# Patient Record
Sex: Female | Born: 1965 | Race: Asian | Hispanic: No | Marital: Married
Health system: Southern US, Community
[De-identification: ages and names within clinical notes are randomized; demographics above are authoritative.]

---

## 2004-10-16 ENCOUNTER — Inpatient Hospital Stay (HOSPITAL_COMMUNITY): Admission: AD | Admit: 2004-10-16 | Discharge: 2004-10-20 | Payer: Self-pay | Admitting: Obstetrics and Gynecology

## 2005-12-04 ENCOUNTER — Encounter: Admission: RE | Admit: 2005-12-04 | Discharge: 2005-12-04 | Payer: Self-pay | Admitting: Obstetrics and Gynecology

## 2006-12-07 ENCOUNTER — Encounter: Admission: RE | Admit: 2006-12-07 | Discharge: 2006-12-07 | Payer: Self-pay | Admitting: Obstetrics and Gynecology

## 2007-12-08 ENCOUNTER — Encounter: Admission: RE | Admit: 2007-12-08 | Discharge: 2007-12-08 | Payer: Self-pay | Admitting: Obstetrics

## 2008-07-24 ENCOUNTER — Ambulatory Visit (HOSPITAL_COMMUNITY): Admission: RE | Admit: 2008-07-24 | Discharge: 2008-07-24 | Payer: Self-pay | Admitting: General Surgery

## 2008-07-24 ENCOUNTER — Encounter (INDEPENDENT_AMBULATORY_CARE_PROVIDER_SITE_OTHER): Payer: Self-pay | Admitting: General Surgery

## 2008-12-14 ENCOUNTER — Encounter: Admission: RE | Admit: 2008-12-14 | Discharge: 2008-12-14 | Payer: Self-pay | Admitting: Obstetrics

## 2009-12-16 ENCOUNTER — Encounter: Admission: RE | Admit: 2009-12-16 | Discharge: 2009-12-16 | Payer: Self-pay | Admitting: Obstetrics

## 2010-04-06 ENCOUNTER — Encounter: Payer: Self-pay | Admitting: Obstetrics and Gynecology

## 2010-06-24 LAB — DIFFERENTIAL
Basophils Absolute: 0 10*3/uL (ref 0.0–0.1)
Basophils Relative: 0 % (ref 0–1)
Eosinophils Relative: 2 % (ref 0–5)
Monocytes Absolute: 0.4 10*3/uL (ref 0.1–1.0)
Monocytes Relative: 10 % (ref 3–12)

## 2010-06-24 LAB — COMPREHENSIVE METABOLIC PANEL
AST: 24 U/L (ref 0–37)
Albumin: 4.1 g/dL (ref 3.5–5.2)
Alkaline Phosphatase: 47 U/L (ref 39–117)
Chloride: 102 mEq/L (ref 96–112)
GFR calc Af Amer: >60 mL/min (ref >60)
Potassium: 4.2 mEq/L (ref 3.5–5.1)
Sodium: 139 mEq/L (ref 135–145)
Total Bilirubin: 1.3 mg/dL — ABNORMAL HIGH (ref 0.3–1.2)

## 2010-06-24 LAB — CBC
HCT: 38.1 % (ref 36.0–46.0)
Platelets: 351 10*3/uL (ref 150–400)
WBC: 4.3 10*3/uL (ref 4.0–10.5)

## 2010-06-24 LAB — PREGNANCY, URINE: Preg Test, Ur: NEGATIVE

## 2010-07-29 NOTE — Op Note (Signed)
Shelly Miller, Shelly Miller             ACCOUNT NO.:  [id number]   MEDICAL RECORD NO.:  [id number]          PATIENT TYPE:  AMB   LOCATION:  DAY                          FACILITY:  [facility]   PHYSICIAN:  Sharlet Salina T. Hoxworth, M.D.DATE OF BIRTH:  [birth year]   DATE OF PROCEDURE:  07/24/2008  DATE OF DISCHARGE:                               OPERATIVE REPORT   PREOPERATIVE DIAGNOSIS:  Internal hemorrhoids with prolapse and  bleeding.   POSTOPERATIVE DIAGNOSIS:  Internal hemorrhoids with prolapse and  bleeding.   SURGICAL PROCEDURES:  PPH stapled hemorrhoidectomy.   SURGEON:  Lorne Skeens. Hoxworth, M.D.   ANESTHESIA:  General.   BRIEF HISTORY:  Ms. Busser is a 45 year old female who, since  pregnancy, has had gradually increasing problems with internal  hemorrhoids which cause prolapse, swelling and now daily significant  bleeding.  She has had a colonoscopy showing no other problems and large  internal hemorrhoids were confirmed on anoscopy in the office.  We  discussed options and I have recommended proceeding with PPH.  The  nature of the procedure, its indications, expected results, recovery,  risks of bleeding, infection and rare rectovaginal fistula were  discussed and understood.  She is now brought to the operating room for  this procedure.   DESCRIPTION OF OPERATION:  The rectal prep was performed at home.  The  patient was brought to the operating room and general endotracheal  anesthesia was used on the stretcher and she was rolled into a carefully  padded prone jack-knife position.  Buttocks were taped apart.  The  perineum was widely sterilely prepped and draped.  Correct patient and  procedure were verified.  Initially 1 mL of Wydase and 9 mL of Marcaine  was injected into the hemorrhoidal groups.  Following this, a perirectal  block with 40 mL of Marcaine was performed.  The anus was then gently  dilated in 3 fingers and then the large bullet rectal retractor placed.  Noted were very large internal hemorrhoids with some degree of  friability.  Beginning at the posterior midline, carefully measured 5 cm  from the dentate line, a concentric pursestring suture of 2-0 Prolene  was placed incorporating mucosa and submucosa.  The bullet retractor was  then removed and the pursestring suture palpated and was symmetrical and  intact.  The sutures were then brought through the [facility] retractor which  was positioned and then the anvil of the PPH stapler introduced through  the pursestring suture which was tied into place snugly around the shaft  of the stapler.  The sutures were brought through the stapler and then  the stapler was closed, advancing it into the rectum more than 4 cm  beyond the dentate line.  The stapler was closed snugly and left in  place for 1 minute for hemostasis and then fired without difficulty.  The stapler was removed without difficulty and tissue ring was examined  which contained a nice 1-2-cm cuff of mucosa and submucosa.  The staple  line was then carefully examined, was intact and there was just one area  of minor oozing posteriorly which was controlled with  a figure-of-eight  suture of 3-0 chromic.  Complete hemostasis was assured and then a pack  of Gelfoam and lidocaine was placed.  Sponge, needle and instrument  counts were correct.  The patient was taken to recovery in good  condition.      Lorne Skeens. Hoxworth, M.D.  Electronically Signed     BTH/MEDQ  D:  07/24/2008  T:  07/24/2008  Job:  [id number]

## 2010-08-01 NOTE — Op Note (Signed)
[name], Shelly Miller             ACCOUNT NO.:  [id number]   MEDICAL RECORD NO.:  [id number]          PATIENT TYPE:  INP   LOCATION:  9127                          FACILITY:  [facility]   PHYSICIAN:  Genia Del, M.D.DATE OF BIRTH:  03/24/1965   DATE OF PROCEDURE:  10/17/2004  DATE OF DISCHARGE:                                 OPERATIVE REPORT   PREOPERATIVE DIAGNOSES:  1.  Intrauterine pregnancy 41 weeks and 5 days.  2.  Induction for post dates.  3.  Failure to progress.   POSTOPERATIVE DIAGNOSES:  1.  Intrauterine pregnancy 41 weeks and 5 days.  2.  Induction for post dates.  3.  Failure to progress.   OPERATION/PROCEDURE:  Intervention urgent primary low transverse cesarean  section.   ANESTHESIA:  Epidural.   SURGEON:  Genia Del, M.D.   ANESTHESIOLOGIST:  Quillian Quince, M.D.   DESCRIPTION OF PROCEDURE:  Under epidural anesthesia, the patient is in 15-  degree left decubitus position. She is prepped with Betadine on the  abdominal, suprapubic, and vulvar areas.  The bladder catheter is already in  place and the patient is draped as usual.  The level of anesthesia is  verified and is adequate.  We infiltrate the subcutaneous tissue with  Marcaine 0.25% plain, 20 mL and then make a Pfannenstiel incision with a  scalpel.  We opened the adipose tissue with the electrocautery and opened  the aponeurosis transversely with the electrocautery and Mayo scissors.  The  rectus muscles are separated from the aponeurosis on the midline superiorly  and inferiorly.  We then opened the parietal peritoneum longitudinally with  Metzenbaum scissors.  We then opened the visceral peritoneum over the lower  uterine segment transversely with Metzenbaum scissors.  The bladder is  reclined downward.  The bladder retractor is put in place.  We make a low  transverse hysterotomy with the scalpel and extend it on each side with  dressing scissors.  The amniotic fluid is clear.  The  fetus is in cephalic  presentation, occiput posterior.  Birth of a baby girl at 2121. A loose  nuchal cord is present.  The baby is suctioned after delivery of the head.  We then clamped and cut the cord.  The baby was given to the neonatal team.  Apgars are 9 and 9.  PH is done and comes back at 7.34.  Spontaneous  evacuation of the placenta, three vessels, complete.  Revision of the  intrauterine cavity.  Pitocin IV is started.  The uterus responds well with  good uterine tones.  Both ovaries are normal to inspection.  Both tubes are  normal as well.  We close the hysterotomy with a first locked running suture  of 0 Vicryl.  A second layer imbricated of 0 Vicryl is done.  An  X stitch  with 0 Vicryl is added to complete hemostasis on the right aspect of the  incision.  We then irrigated and suctioned the abdominopelvic cavity.  Complete hemostasis with the electrocautery on the rectus muscles and the  bladder flap.  We then closed the aponeurosis in the  hospital two half-  running sutures of 0 Vicryl. With complete hemostasis of the adipose tissue  with the electrocautery.  We reapproximated the skin with staples.  A dry  dressing is applied.  Estimated blood loss is 800  mL.  The counts of sponges and instruments were complete x2.  No  complications occurred and the patient was brought to the recovery room in  good status.   A Flagyl IV was given after cord clamping.       ML/MEDQ  D:  10/17/2004  T:  10/18/2004  Job:  [id number]

## 2010-08-01 NOTE — Discharge Summary (Signed)
Shelly Miller, Shelly Miller             ACCOUNT NO.:  [id number]   MEDICAL RECORD NO.:  [id number]          PATIENT TYPE:  INP   LOCATION:  9127                          FACILITY:  [facility]   PHYSICIAN:  Genia Del, M.D.DATE OF BIRTH:  January 28, 1966   DATE OF ADMISSION:  10/16/2004  DATE OF DISCHARGE:  10/20/2004                                 DISCHARGE SUMMARY   ADMISSION DIAGNOSIS:  Forty-one-plus weeks gestation, for induction.   DISCHARGE DIAGNOSES:  1.  Forty-one-plus weeks gestation, for induction.  2.  Failure to progress in dilatation.   SURGERY:  Urgent primary low transverse cesarean section, birth of a baby  girl.   HOSPITAL COURSE:  The patient progressed into labor until 6 cm. It was a  right posterior occiput presentation. In spite of good uterine contractions,  she failed to progress further and a decision was taken to proceed with  urgent C-section. She had an urgent primary low transverse cesarean section,  birth of a baby girl at 2121, pH was 7.34. Estimated blood loss was 800 mL.  No complication occurred. Her postpartum evolution was normal. She had a  hemoglobin at 10.8 postpartum. She was discharged home on postoperative day  #3 in good status. Postoperative advice were given. She will come back to  [facility] in 4 weeks. She was given a prescription of Tylox p.r.n. for  pain.      Genia Del, M.D.  Electronically Signed     ML/MEDQ  D:  11/24/2004  T:  11/24/2004  Job:  [id number]

## 2010-08-01 NOTE — H&P (Signed)
NAMESTEFFANIE, MINGLE             ACCOUNT NO.:  [id number]   MEDICAL RECORD NO.:  [id number]          PATIENT TYPE:  INP   LOCATION:  9173                          FACILITY:  [facility]   PHYSICIAN:  Richardean Sale, M.D.   DATE OF BIRTH:  11/03/1965   DATE OF ADMISSION:  10/16/2004  DATE OF DISCHARGE:                                HISTORY & PHYSICAL   ADMITTING DIAGNOSIS:  Forty-one plus week intrauterine pregnancy, for  induction of labor.   HISTORY OF PRESENT ILLNESS:  This is a 45 year old, gravida 1, para 0, Asian  female who is at 11.[redacted] weeks gestation with a due date of October 07, 2004  confirmed by first trimester ultrasound at another medical care facility,  who presents today for induction of labor secondary to post dates.  The  patient reports good movement.  Denies any loss of fluid or vaginal  bleeding.  Complains of occasional contractions.  Denies headaches, visual  changes, or epigastric pain.   PRENATAL CARE:  Initially, prenatal care was in [place], [place].  The  patient transferred care to [facility] at 33 weeks.  The pregnancy is  complicated by advanced maternal age.  The patient underwent amniocentesis,  which was normal.   The patient presents today for induction of labor secondary to post dates.   PAST OBSTETRIC HISTORY:  Gravida 1, para 0.   GYNECOLOGIC HISTORY:  No known abnormal Pap smears or sexually transmitted  infections.   MEDICAL HISTORY:  History of anemia.  The last hemoglobin was within normal  limits.   PAST SURGICAL HISTORY:  None.   SOCIAL HISTORY:  She is married.  Denies tobacco, alcohol, or drugs.   FAMILY HISTORY:  Mother had cancer of the cervix at age 86.  Otherwise,  noncontributory.  No known birth defects, congenital anomalies, cystic  fibrosis, Down syndrome, or other inherited illnesses.   MEDICATIONS:  Prenatal vitamins.   ALLERGIES:  PENICILLIN.   PHYSICAL EXAMINATION:  VITAL SIGNS:  Blood pressure is 124/73, heart  rate  73, respirations 20, temperature 98.1.  Fetal heart rate tracing is  reactive.  Tocometer shows no contractions.  GENERAL:  She is a well-developed, well-nourished Asian female who is in no  acute distress.  HEART:  Regular rate and rhythm.  LUNGS:  Clear to auscultation bilaterally.  ABDOMEN:  Gravid, soft, nontender.  Fundal height is AGA.  PELVIC:  Cervix is flared, 2 cm long, -2 station.  Ultrasound was performed  to confirm vertex presentation.  There are multiple external hemorrhoids  present that are not thrombosed.  EXTREMITIES:  Trace edema in the feet bilaterally.  Nontender.   PRENATAL LABORATORIES:  Blood type is A positive, antibody screen negative,  RPR nonreactive, rubella immune.  Hepatitis B surface antigen nonreactive.  HIV was declined.  Group B beta strep negative.  Amniocentesis showed normal  chromosomes.  Glucose tolerance test performed at another institution was  normal.  The last ultrasound on October 10, 2004 at 40.[redacted] weeks gestation showed  estimated fetal weight of 3737 gm, which is the 54th percentile.  Amniotic  fluid index was normal.  ASSESSMENT:  A 45 year old, gravida 1, para 0, Asian female at 41.[redacted] weeks  gestation by first trimester ultrasound, who presents for induction of  labor.   PLAN:  1.  Will admit to labor and delivery.  Check routine labs.  2.  Will use Cervidil for cervical ripening, and start low-dose Pitocin, and      perform amniotomy when able.  3.  Continuous fetal monitoring.  4.  I reviewed the increased risk of cesarean section, given post date      induction.  The patient and her husband voice understanding of the risks      of induction, and have been counseled on their alternatives, and desire      to proceed with induction.  All questions answered and informed consent      obtained.       JW/MEDQ  D:  10/16/2004  T:  10/17/2004  Job:  [id number]

## 2011-03-27 ENCOUNTER — Encounter (INDEPENDENT_AMBULATORY_CARE_PROVIDER_SITE_OTHER): Payer: Self-pay | Admitting: General Surgery

## 2012-06-07 ENCOUNTER — Other Ambulatory Visit: Payer: Self-pay

## 2012-06-07 DIAGNOSIS — Z1231 Encounter for screening mammogram for malignant neoplasm of breast: Secondary | ICD-10-CM

## 2012-07-11 ENCOUNTER — Ambulatory Visit
Admission: RE | Admit: 2012-07-11 | Discharge: 2012-07-11 | Disposition: A | Discharge status: Home / Self Care | Payer: BC Managed Care – PPO | Source: Ambulatory Visit

## 2012-07-11 DIAGNOSIS — Z1231 Encounter for screening mammogram for malignant neoplasm of breast: Secondary | ICD-10-CM

## 2013-06-23 ENCOUNTER — Other Ambulatory Visit: Payer: Self-pay

## 2013-06-23 DIAGNOSIS — Z1231 Encounter for screening mammogram for malignant neoplasm of breast: Secondary | ICD-10-CM

## 2013-07-12 ENCOUNTER — Ambulatory Visit
Admission: RE | Admit: 2013-07-12 | Discharge: 2013-07-12 | Disposition: A | Discharge status: Home / Self Care | Payer: BC Managed Care – PPO | Source: Ambulatory Visit

## 2013-07-12 ENCOUNTER — Encounter (INDEPENDENT_AMBULATORY_CARE_PROVIDER_SITE_OTHER): Payer: Self-pay

## 2013-07-12 DIAGNOSIS — Z1231 Encounter for screening mammogram for malignant neoplasm of breast: Secondary | ICD-10-CM

## 2016-03-02 ENCOUNTER — Other Ambulatory Visit: Payer: Self-pay | Admitting: Obstetrics

## 2016-03-02 DIAGNOSIS — Z1231 Encounter for screening mammogram for malignant neoplasm of breast: Secondary | ICD-10-CM

## 2016-03-03 ENCOUNTER — Ambulatory Visit
Admission: RE | Admit: 2016-03-03 | Discharge: 2016-03-03 | Disposition: A | Discharge status: Home / Self Care | Payer: Self-pay | Source: Ambulatory Visit | Attending: Obstetrics | Admitting: Obstetrics

## 2016-03-03 DIAGNOSIS — Z1231 Encounter for screening mammogram for malignant neoplasm of breast: Secondary | ICD-10-CM | POA: Diagnosis not present

## 2016-09-30 DIAGNOSIS — R0981 Nasal congestion: Secondary | ICD-10-CM | POA: Diagnosis not present

## 2016-09-30 DIAGNOSIS — J020 Streptococcal pharyngitis: Secondary | ICD-10-CM | POA: Diagnosis not present

## 2016-11-10 DIAGNOSIS — Z6831 Body mass index (BMI) 31.0-31.9, adult: Secondary | ICD-10-CM | POA: Diagnosis not present

## 2016-11-10 DIAGNOSIS — Z1151 Encounter for screening for human papillomavirus (HPV): Secondary | ICD-10-CM | POA: Diagnosis not present

## 2016-11-10 DIAGNOSIS — Z01419 Encounter for gynecological examination (general) (routine) without abnormal findings: Secondary | ICD-10-CM | POA: Diagnosis not present

## 2017-08-16 ENCOUNTER — Ambulatory Visit
Admission: RE | Admit: 2017-08-16 | Discharge: 2017-08-16 | Disposition: A | Discharge status: Home / Self Care | Payer: BLUE CROSS/BLUE SHIELD | Source: Ambulatory Visit | Attending: Family Medicine | Admitting: Family Medicine

## 2017-08-16 ENCOUNTER — Other Ambulatory Visit: Payer: Self-pay | Admitting: Family Medicine

## 2017-08-16 DIAGNOSIS — T1490XA Injury, unspecified, initial encounter: Secondary | ICD-10-CM

## 2017-08-16 DIAGNOSIS — S92354A Nondisplaced fracture of fifth metatarsal bone, right foot, initial encounter for closed fracture: Secondary | ICD-10-CM | POA: Diagnosis not present

## 2018-02-02 DIAGNOSIS — Z01419 Encounter for gynecological examination (general) (routine) without abnormal findings: Secondary | ICD-10-CM | POA: Diagnosis not present

## 2018-02-02 DIAGNOSIS — Z6828 Body mass index (BMI) 28.0-28.9, adult: Secondary | ICD-10-CM | POA: Diagnosis not present

## 2018-02-02 DIAGNOSIS — Z1231 Encounter for screening mammogram for malignant neoplasm of breast: Secondary | ICD-10-CM | POA: Diagnosis not present

## 2018-02-02 DIAGNOSIS — Z30432 Encounter for removal of intrauterine contraceptive device: Secondary | ICD-10-CM | POA: Diagnosis not present

## 2019-02-16 DIAGNOSIS — B372 Candidiasis of skin and nail: Secondary | ICD-10-CM | POA: Diagnosis not present

## 2019-02-16 DIAGNOSIS — N952 Postmenopausal atrophic vaginitis: Secondary | ICD-10-CM | POA: Diagnosis not present

## 2019-02-16 DIAGNOSIS — Z01419 Encounter for gynecological examination (general) (routine) without abnormal findings: Secondary | ICD-10-CM | POA: Diagnosis not present

## 2019-02-16 DIAGNOSIS — Z6830 Body mass index (BMI) 30.0-30.9, adult: Secondary | ICD-10-CM | POA: Diagnosis not present

## 2019-05-12 ENCOUNTER — Other Ambulatory Visit: Payer: Self-pay | Admitting: Family Medicine

## 2019-05-12 DIAGNOSIS — R519 Headache, unspecified: Secondary | ICD-10-CM

## 2019-05-12 NOTE — Progress Notes (Signed)
PT w/ questionable h/o Stroke as etiology for permenant vision loss years ago. Developed new intermittent HA features for past 2-3 mo. Getting worse. No other neurological deficits. HA in forehead region and lasting minutes.

## 2019-06-06 ENCOUNTER — Other Ambulatory Visit: Payer: Self-pay

## 2019-06-06 ENCOUNTER — Ambulatory Visit
Admission: RE | Admit: 2019-06-06 | Discharge: 2019-06-06 | Disposition: A | Discharge status: Home / Self Care | Payer: BC Managed Care – PPO | Source: Ambulatory Visit | Attending: Family Medicine | Admitting: Family Medicine

## 2019-06-06 DIAGNOSIS — R519 Headache, unspecified: Secondary | ICD-10-CM | POA: Diagnosis not present

## 2019-07-12 DIAGNOSIS — H524 Presbyopia: Secondary | ICD-10-CM | POA: Diagnosis not present

## 2019-07-12 DIAGNOSIS — H25013 Cortical age-related cataract, bilateral: Secondary | ICD-10-CM | POA: Diagnosis not present

## 2019-07-12 DIAGNOSIS — H2513 Age-related nuclear cataract, bilateral: Secondary | ICD-10-CM | POA: Diagnosis not present

## 2019-07-12 DIAGNOSIS — H5203 Hypermetropia, bilateral: Secondary | ICD-10-CM | POA: Diagnosis not present

## 2020-11-20 IMAGING — MR MR HEAD W/O CM
3 series · 48 of 48 positions shown · non-contrast
Comparison: None.

CLINICAL DATA: Chronic headache with no new features

EXAM:
MRI HEAD WITHOUT CONTRAST
MRA HEAD WITHOUT CONTRAST
TECHNIQUE: Multiplanar, multiecho pulse sequences of the brain and surrounding
structures were obtained without intravenous contrast. Angiographic
images of the head were obtained using MRA technique without
contrast.

[Series 12: DWI · coronal · 5.0mm · 1.80mm/px · 24 of 68 slices shown]
[im 1/68]
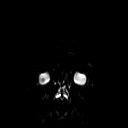
[im 3/68]
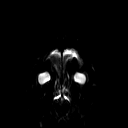
[im 6/68]
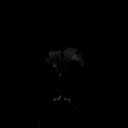
[im 9/68]
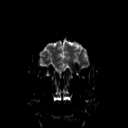
[im 12/68]
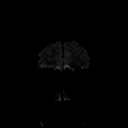
[im 15/68]
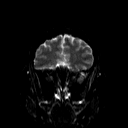
[im 18/68]
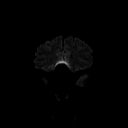
[im 21/68]
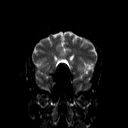
[im 24/68]
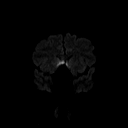
[im 27/68]
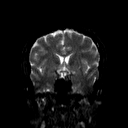
[im 30/68]
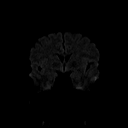
[im 33/68]
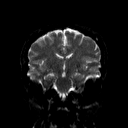
[im 35/68]
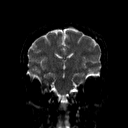
[im 38/68]
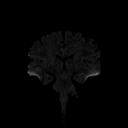
[im 41/68]
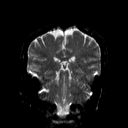
[im 44/68]
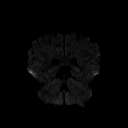
[im 47/68]
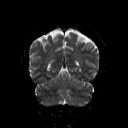
[im 50/68]
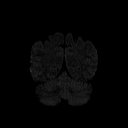
[im 53/68]
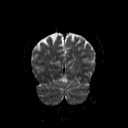
[im 56/68]
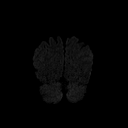
[im 59/68]
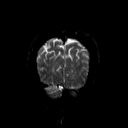
[im 62/68]
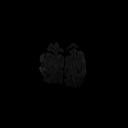
[im 65/68]
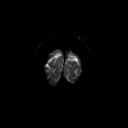
[im 68/68]
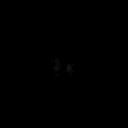

[Series 19: FLAIR · axial · 3.0mm · 0.45mm/px · z∈[-68,+67]mm · 11 of 30 slices shown]
[im 1/30]
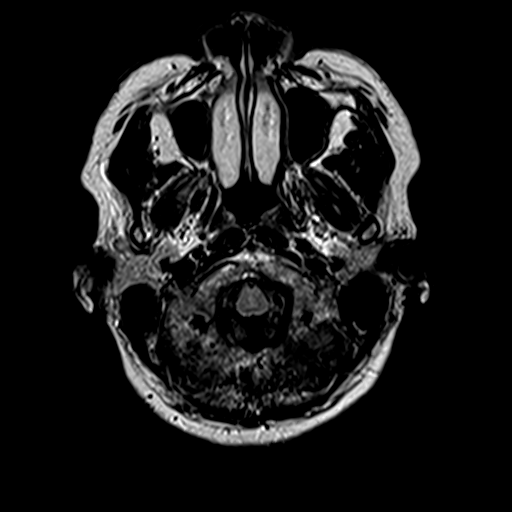
[im 3/30]
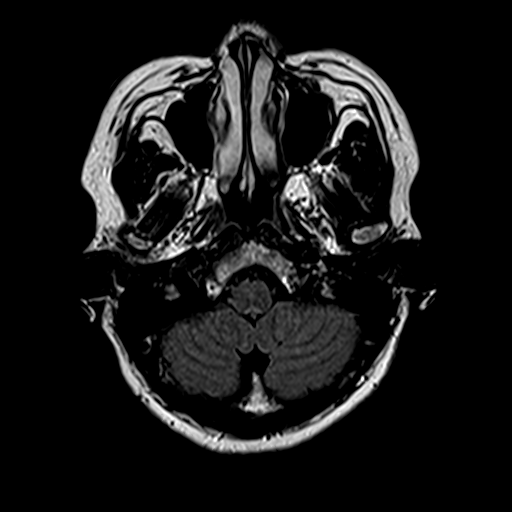
[im 6/30]
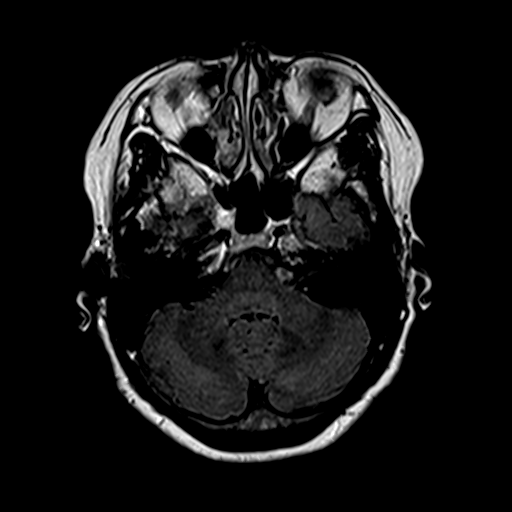
[im 9/30]
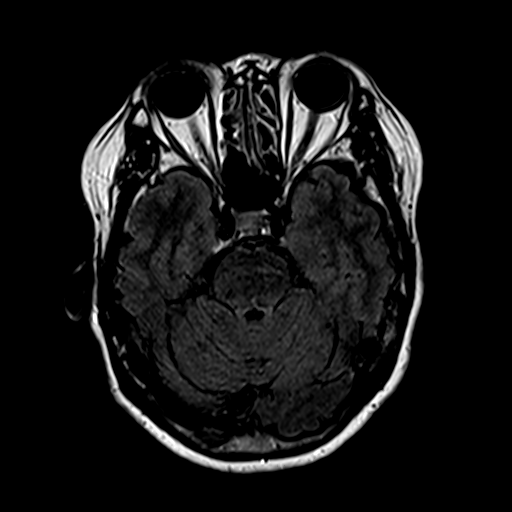
[im 12/30]
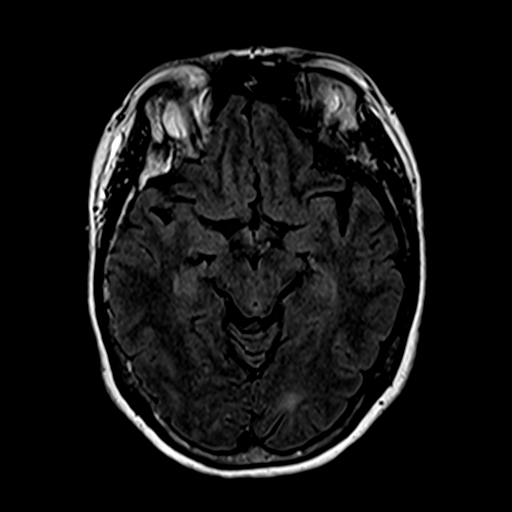
[im 15/30]
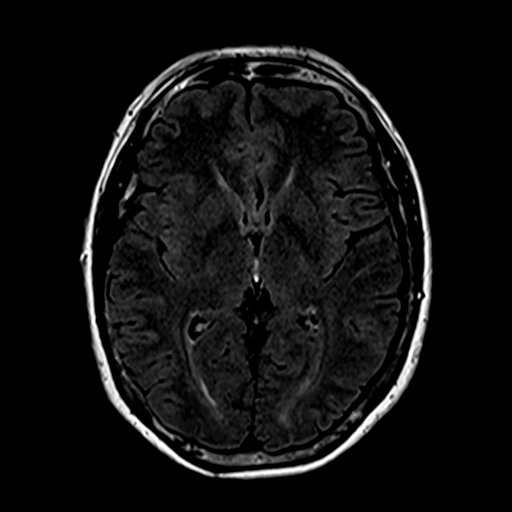
[im 18/30]
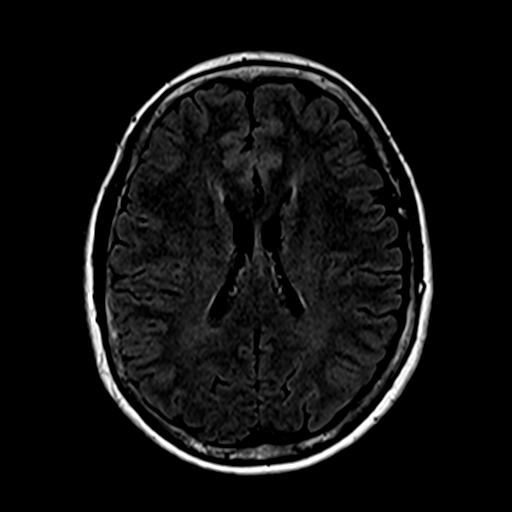
[im 21/30]
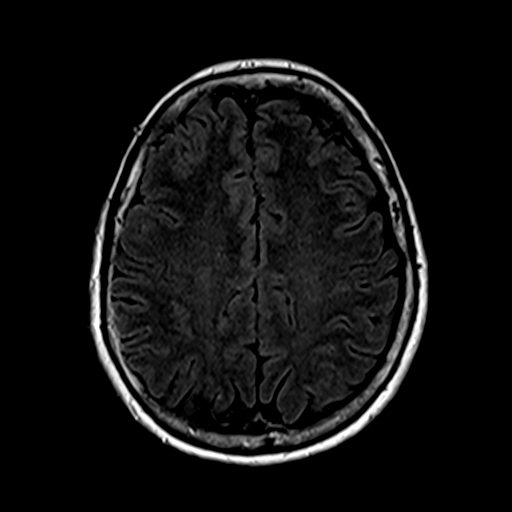
[im 24/30]
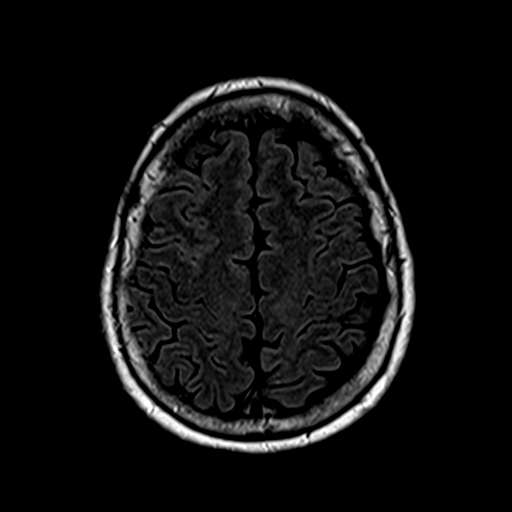
[im 27/30]
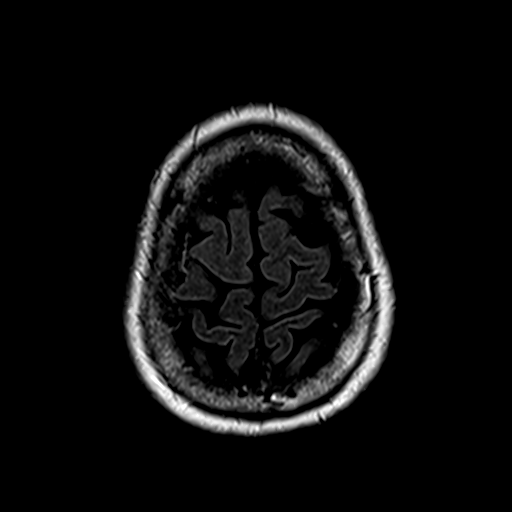
[im 30/30]
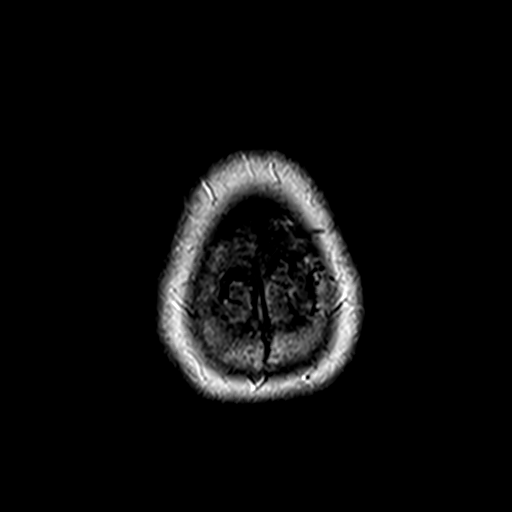

[Series 24: swi_images · axial · 4.0mm · 0.90mm/px · z∈[-70,+70]mm · 13 of 36 slices shown]
[im 1/36]
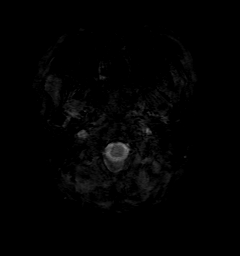
[im 3/36]
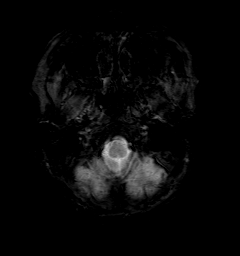
[im 6/36]
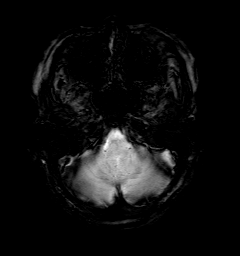
[im 9/36]
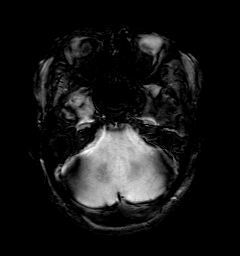
[im 12/36]
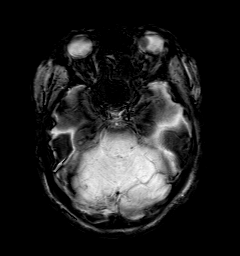
[im 15/36]
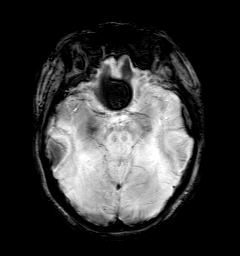
[im 18/36]
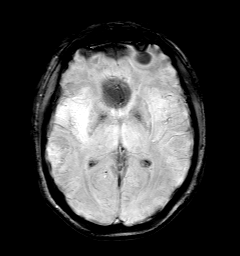
[im 21/36]
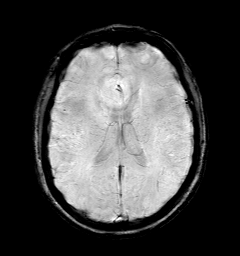
[im 24/36]
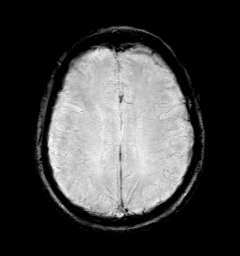
[im 27/36]
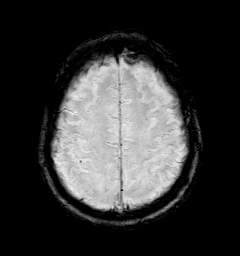
[im 30/36]
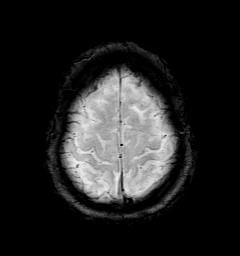
[im 33/36]
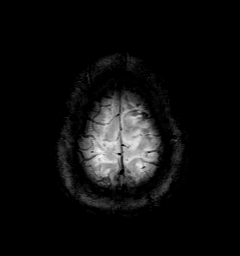
[im 36/36]
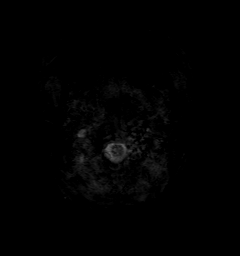

[48 of 48 positions shown; findings below may reference images not displayed]

FINDINGS: MRI HEAD FINDINGS

Brain: No infarction, hemorrhage, hydrocephalus, extra-axial
collection or mass lesion. No atrophy or white matter disease

Vascular: Arterial findings below.  Normal flow voids.

Skull and upper cervical spine: Normal marrow signal

Sinuses/Orbits: Mild mucosal thickening in the ethmoids. No fluid
level for acute sinusitis.

MRA HEAD FINDINGS

Unremarkable intracranial anatomy. Vessels are smooth and widely
patent. No branch occlusion, beading, or aneurysm.
IMPRESSION: Negative brain MRI and MRA.

## 2022-04-27 ENCOUNTER — Other Ambulatory Visit: Payer: Self-pay | Admitting: Family Medicine

## 2022-04-27 ENCOUNTER — Ambulatory Visit
Admission: RE | Admit: 2022-04-27 | Discharge: 2022-04-27 | Disposition: A | Discharge status: Home / Self Care | Payer: 59 | Source: Ambulatory Visit | Attending: Family Medicine | Admitting: Family Medicine

## 2022-04-27 DIAGNOSIS — M25562 Pain in left knee: Secondary | ICD-10-CM
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
## 2024-01-25 ENCOUNTER — Other Ambulatory Visit: Payer: Self-pay | Admitting: Family Medicine

## 2024-01-25 DIAGNOSIS — I2510 Atherosclerotic heart disease of native coronary artery without angina pectoris: Secondary | ICD-10-CM

## 2024-01-25 NOTE — Progress Notes (Signed)
 Pt w/ mod ASCVD risk.  HLD, age, fmhx unknown.  CAC ordered.

## 2024-02-04 ENCOUNTER — Ambulatory Visit (HOSPITAL_BASED_OUTPATIENT_CLINIC_OR_DEPARTMENT_OTHER)
Admission: RE | Admit: 2024-02-04 | Discharge: 2024-02-04 | Disposition: A | Discharge status: Home / Self Care | Payer: Self-pay | Source: Ambulatory Visit | Attending: Family Medicine | Admitting: Family Medicine

## 2024-02-04 DIAGNOSIS — I2510 Atherosclerotic heart disease of native coronary artery without angina pectoris: Secondary | ICD-10-CM | POA: Insufficient documentation
